# Patient Record
Sex: Male | Born: 1980 | Race: Black or African American | Hispanic: No | Marital: Single | Smoking: Current some day smoker
Health system: Southern US, Community
[De-identification: ages and names within clinical notes are randomized; demographics above are authoritative.]

---

## 2005-11-20 ENCOUNTER — Emergency Department (HOSPITAL_COMMUNITY): Admission: EM | Admit: 2005-11-20 | Discharge: 2005-11-20 | Payer: Self-pay | Admitting: Emergency Medicine

## 2006-03-10 ENCOUNTER — Emergency Department: Payer: Self-pay | Admitting: Emergency Medicine

## 2013-07-31 ENCOUNTER — Encounter: Payer: Self-pay | Admitting: Family Medicine

## 2013-07-31 ENCOUNTER — Ambulatory Visit (INDEPENDENT_AMBULATORY_CARE_PROVIDER_SITE_OTHER): Payer: PRIVATE HEALTH INSURANCE | Admitting: Family Medicine

## 2013-07-31 VITALS — BP 121/71 | HR 70 | Temp 98.5°F | Ht 72.0 in | Wt 210.1 lb

## 2013-07-31 DIAGNOSIS — Z0000 Encounter for general adult medical examination without abnormal findings: Secondary | ICD-10-CM

## 2013-07-31 DIAGNOSIS — Z23 Encounter for immunization: Secondary | ICD-10-CM

## 2013-07-31 DIAGNOSIS — Z13220 Encounter for screening for lipoid disorders: Secondary | ICD-10-CM

## 2013-07-31 DIAGNOSIS — Z131 Encounter for screening for diabetes mellitus: Secondary | ICD-10-CM

## 2013-07-31 NOTE — Patient Instructions (Signed)
Dr. Hommel's General Advice Following Your Complete Physical Exam  The Benefits of Regular Exercise: Unless you suffer from an uncontrolled cardiovascular condition, studies strongly suggest that regular exercise and physical activity will add to both the quality and length of your life.  The World Health Organization recommends 150 minutes of moderate intensity aerobic activity every week.  This is best split over 3-4 days a week, and can be as simple as a brisk walk for just over 35 minutes "most days of the week".  This type of exercise has been shown to lower LDL-Cholesterol, lower average blood sugars, lower blood pressure, lower cardiovascular disease risk, improve memory, and increase one's overall sense of wellbeing.  The addition of anaerobic (or "strength training") exercises offers additional benefits including but not limited to increased metabolism, prevention of osteoporosis, and improved overall cholesterol levels.  How Can I Strive For A Low-Fat Diet?: Current guidelines recommend that 25-35 percent of your daily energy (food) intake should come from fats.  One might ask how can this be achieved without having to dissect each meal on a daily basis?  Switch to skim or 1% milk instead of whole milk.  Focus on lean meats such as ground turkey, fresh fish, baked chicken, and lean cuts of beef as your source of dietary protein.  Limit saturated fat consumption to less than 10% of your daily caloric intake.  Limit trans fatty acid consumption primarily by limiting synthetic trans fats such as partially hydrogenated oils (Ex: fried fast foods).  Substitute olive or vegetable oil for solid fats where possible.  Moderation of Salt Intake: Provided you don't carry a diagnosis of congestive heart failure nor renal failure, I recommend a daily allowance of no more than 2300 mg of salt (sodium).  Keeping under this daily goal is associated with a decreased risk of cardiovascular events, creeping  above it can lead to elevated blood pressures and increases your risk of cardiovascular events.  Milligrams (mg) of salt is listed on all nutrition labels, and your daily intake can add up faster than you think.  Most canned and frozen dinners can pack in over half your daily salt allowance in one meal.    Lifestyle Health Risks: Certain lifestyle choices carry specific health risks.  As you may already know, tobacco use has been associated with increasing one's risk of cardiovascular disease, pulmonary disease, numerous cancers, among many other issues.  What you may not know is that there are medications and nicotine replacement strategies that can more than double your chances of successfully quitting.  I would be thrilled to help manage your quitting strategy if you currently use tobacco products.  When it comes to alcohol use, I've yet to find an "ideal" daily allowance.  Provided an individual does not have a medical condition that is exacerbated by alcohol consumption, general guidelines determine "safe drinking" as no more than two standard drinks for a man or no more than one standard drink for a male per day.  However, much debate still exists on whether any amount of alcohol consumption is technically "safe".  My general advice, keep alcohol consumption to a minimum for general health promotion.  If you or others believe that alcohol, tobacco, or recreational drug use is interfering with your life, I would be happy to provide confidential counseling regarding treatment options.  General "Over The Counter" Nutrition Advice: Postmenopausal women should aim for a daily calcium intake of 1200 mg, however a significant portion of this might already be   provided by diets including milk, yogurt, cheese, and other dairy products.  Vitamin D has been shown to help preserve bone density, prevent fatigue, and has even been shown to help reduce falls in the elderly.  Ensuring a daily intake of 800 Units of  Vitamin D is a good place to start to enjoy the above benefits, we can easily check your Vitamin D level to see if you'd potentially benefit from supplementation beyond 800 Units a day.  Folic Acid intake should be of particular concern to women of childbearing age.  Daily consumption of 400-800 mcg of Folic Acid is recommended to minimize the chance of spinal cord defects in a fetus should pregnancy occur.    For many adults, accidents still remain one of the most common culprits when it comes to cause of death.  Some of the simplest but most effective preventitive habits you can adopt include regular seatbelt use, proper helmet use, securing firearms, and regularly testing your smoke and carbon monoxide detectors.  Sean B. Hommel DO [facility] [address], [address] [place], [place] [address] Phone: [phone]  Testicular Self-Exam A self-examination of your testicles involves looking at and feeling your testicles for abnormal lumps or swelling. Several things can cause swelling, lumps, or pain in your testicles. Some of these causes are:  Injuries.  Inflammation.  Infection.  Accumulation of fluids around your testicle (hydrocele).  Twisted testicles (testicular torsion).  Testicular cancer. Self-examination of the testicles and groin areas may be advised if you are at risk for testicular cancer. Risks for testicular cancer include:  An undescended testicle (cryptorchidism).  A history of previous testicular cancer.  A family history of testicular cancer. The testicles are easiest to examine after warm baths or showers and are more difficult to examine when you are cold. This is because the muscles attached to the testicles retract and pull them up higher or into the abdomen. Follow these steps while you are standing:  Hold your penis away from your body.  Roll one testicle between your thumb and forefinger, feeling the entire testicle.  Roll the other  testicle between your thumb and forefinger, feeling the entire testicle. Feel for lumps, swelling, or discomfort. A normal testicle is egg shaped and feels firm. It is smooth and not tender. The spermatic cord can be felt as a firm spaghetti-like cord at the back of your testicle. It is also important to examine the crease between the front of your leg and your abdomen. Feel for any bumps that are tender. These could be enlarged lymph nodes.  Document Released: 05/02/2000 Document Revised: 09/26/2012 Document Reviewed: 07/16/2012 [employer] Patient Information 2015 [employer]. This information is not intended to replace advice given to you by your health care Emaline Karnes. Make sure you discuss any questions you have with your health care Hillard Goodwine.  
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
## 2017-01-18 ENCOUNTER — Other Ambulatory Visit: Payer: Self-pay | Admitting: Occupational Medicine

## 2017-01-18 ENCOUNTER — Ambulatory Visit: Payer: Self-pay

## 2017-01-18 DIAGNOSIS — Z0000 Encounter for general adult medical examination without abnormal findings: Secondary | ICD-10-CM

## 2017-09-20 ENCOUNTER — Other Ambulatory Visit: Payer: Self-pay | Admitting: Occupational Medicine

## 2017-09-20 ENCOUNTER — Ambulatory Visit: Payer: Self-pay

## 2017-09-20 DIAGNOSIS — Z0000 Encounter for general adult medical examination without abnormal findings: Secondary | ICD-10-CM

## 2018-09-29 IMAGING — DX DG CHEST 1V
1 series · 1 of 1 positions shown · non-contrast
Comparison: None.

CLINICAL DATA: Physical examination.  Current smoker.

EXAM:
CHEST 1 VIEW

[chest pa]
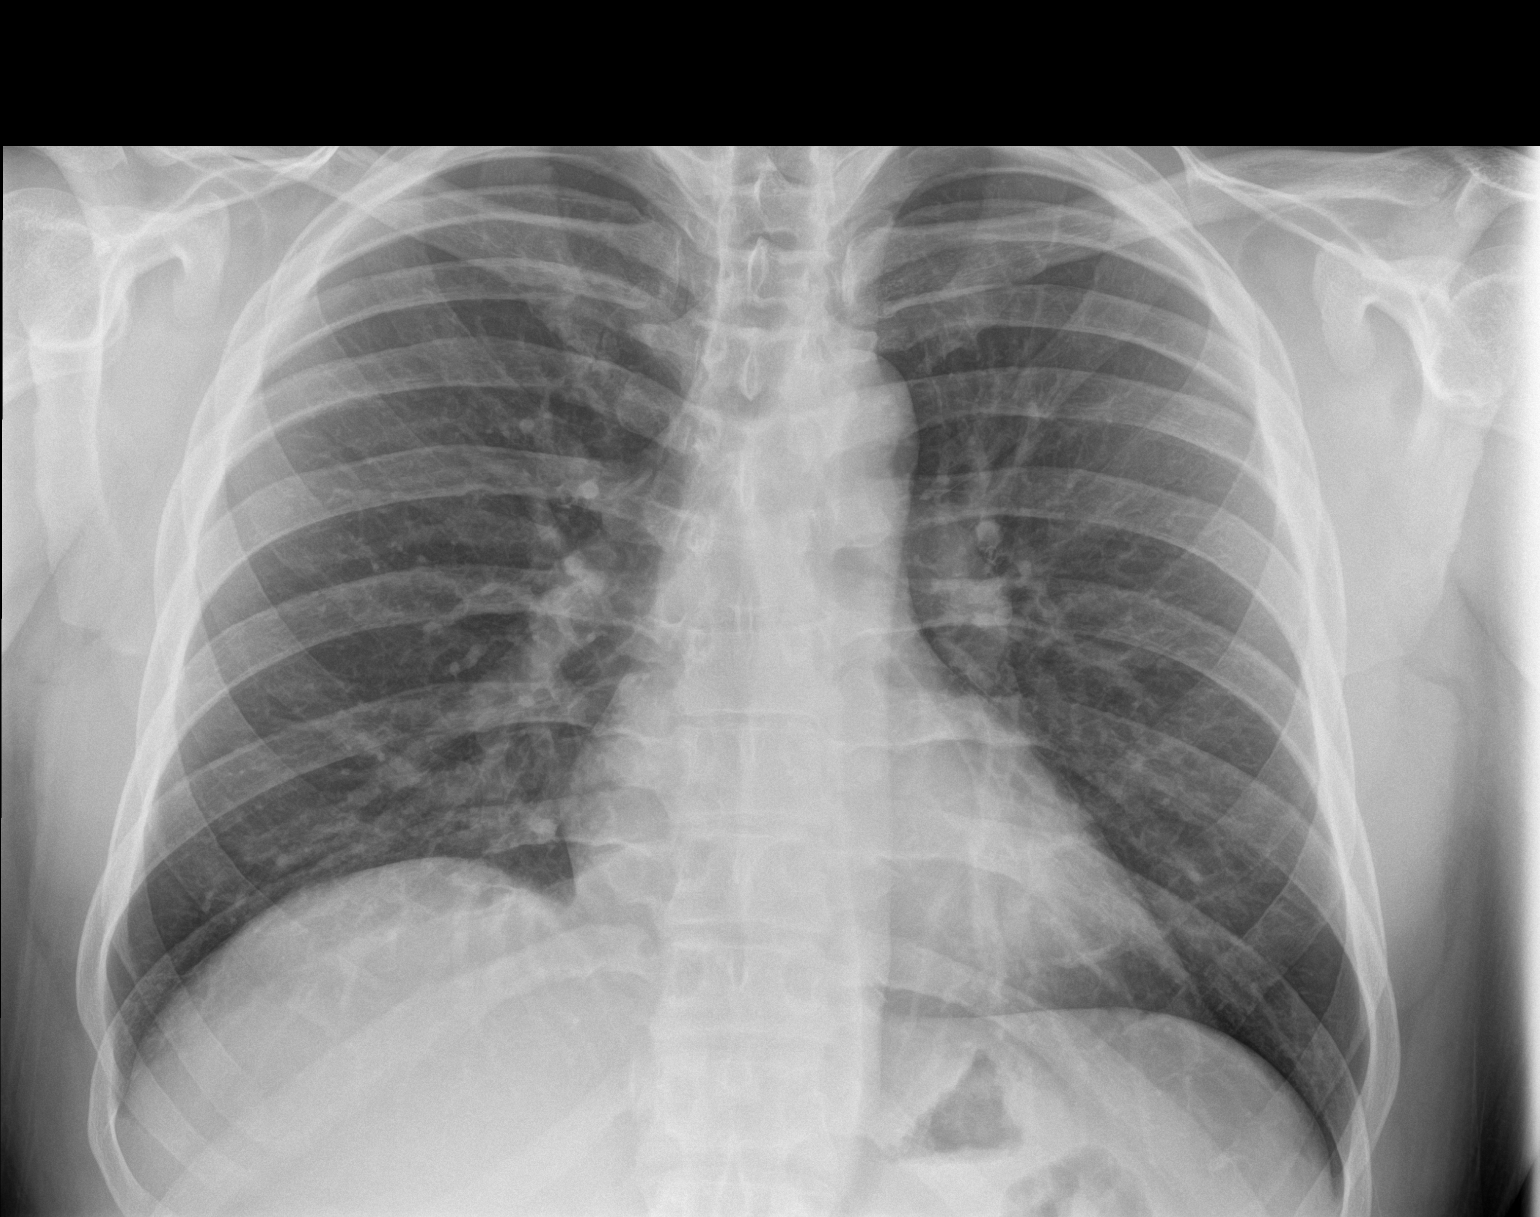

[1 of 1 positions shown; findings below may reference images not displayed]

FINDINGS: The cardiomediastinal silhouette is within normal limits. The lungs
are well inflated and clear. There is no evidence of pleural
effusion or pneumothorax. No acute osseous abnormality is
identified.
IMPRESSION: No active disease.

## 2019-06-01 IMAGING — DX DG CHEST 1V
1 series · 1 of 1 positions shown · non-contrast
Comparison: January 18, 2017

CLINICAL DATA: Physical examination

EXAM:
CHEST  1 VIEW

[chest pa]
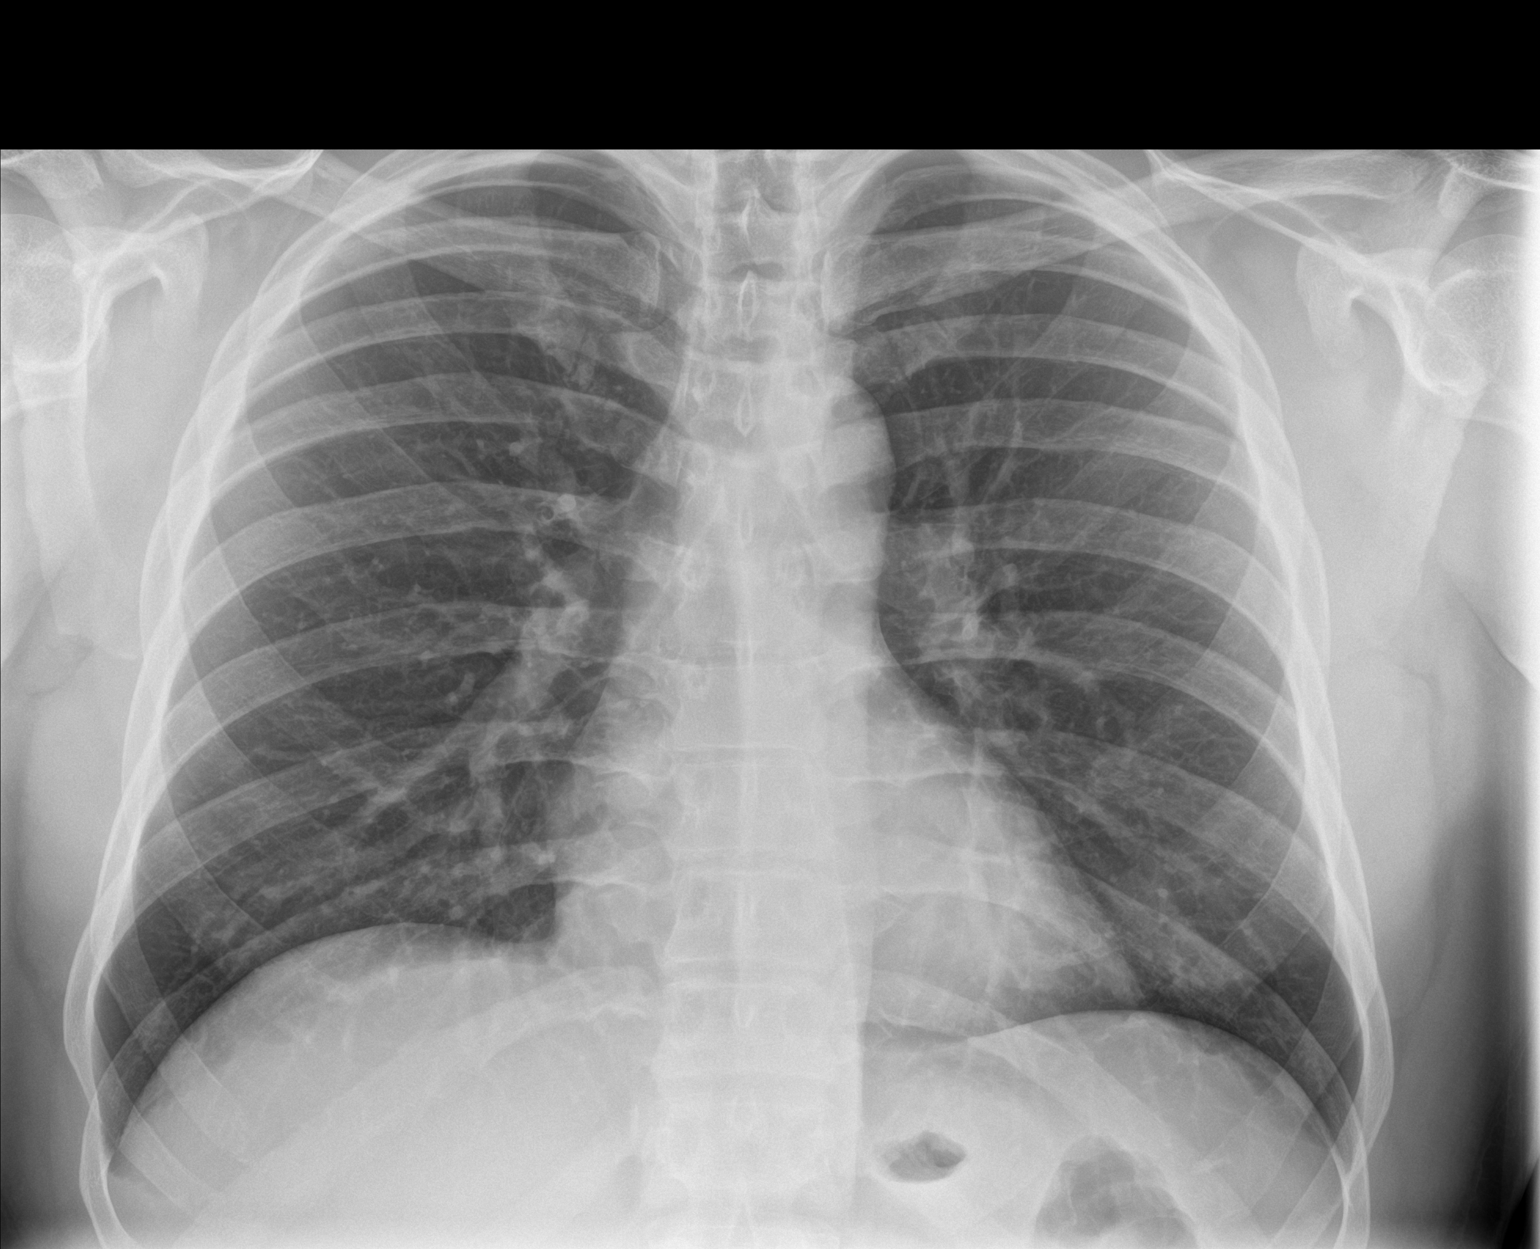

[1 of 1 positions shown; findings below may reference images not displayed]

FINDINGS: Lungs are clear. Heart size and pulmonary vascularity are normal. No
adenopathy. No evident bone lesions.
IMPRESSION: No abnormality noted.

## 2021-08-04 ENCOUNTER — Ambulatory Visit (HOSPITAL_BASED_OUTPATIENT_CLINIC_OR_DEPARTMENT_OTHER): Payer: PRIVATE HEALTH INSURANCE | Admitting: Orthopaedic Surgery

## 2021-08-16 ENCOUNTER — Ambulatory Visit: Payer: BC Managed Care – PPO | Admitting: Podiatry

## 2021-08-16 DIAGNOSIS — B351 Tinea unguium: Secondary | ICD-10-CM

## 2021-08-16 MED ORDER — TERBINAFINE HCL 250 MG PO TABS: 250.0000 mg | ORAL_TABLET | Freq: Every day | ORAL | 0 refills | Status: AC

## 2021-08-16 NOTE — Progress Notes (Signed)
Subjective:   Patient ID: Luke Parker, male   DOB: 41 y.o.   MRN: [id number]   HPI Patient presents with elongated dystrophic nailbeds 1 through 5 both feet that he has had for 15 years and he does wear steel toes at work.  Patient does not smoke likes to be active and they are not currently tender but he does not like the appearance   Review of Systems  All other systems reviewed and are negative.       Objective:  Physical Exam Vitals and nursing note reviewed.  Constitutional:      Appearance: He is well-developed.  Pulmonary:     Effort: Pulmonary effort is normal.  Musculoskeletal:        General: Normal range of motion.  Skin:    General: Skin is warm.  Neurological:     Mental Status: He is alert.     Neurovascular status intact muscle strength adequate range of motion within normal limits with thick yellow brittle nailbeds 1-5 both feet dystrophic and somewhat hard to cut.  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  Patient will be seen back to recheck encouraged to call with questions that may come up

## 2021-08-17 LAB — HEPATIC FUNCTION PANEL
AG Ratio: 2.1 (calc) (ref 1.0–2.5)
ALT: 16 U/L (ref 9–46)
AST: 14 U/L (ref 10–40)
Albumin: 4.4 g/dL (ref 3.6–5.1)
Alkaline phosphatase (APISO): 48 U/L (ref 36–130)
Bilirubin, Direct: 0.1 mg/dL (ref 0.0–0.2)
Globulin: 2.1 g/dL (calc) (ref 1.9–3.7)
Indirect Bilirubin: 0.5 mg/dL (calc) (ref 0.2–1.2)
Total Bilirubin: 0.6 mg/dL (ref 0.2–1.2)
Total Protein: 6.5 g/dL (ref 6.1–8.1)
